# Patient Record
Sex: Male | Born: 1985 | Race: White | Hispanic: No | Smoking: Current every day smoker
Health system: Southern US, Community
[De-identification: ages and names within clinical notes are randomized; demographics above are authoritative.]

## PROBLEM LIST (undated history)

## (undated) HISTORY — PX: MANDIBLE FRACTURE SURGERY: SHX706

---
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
## 2006-01-10 ENCOUNTER — Emergency Department (HOSPITAL_COMMUNITY): Admission: EM | Admit: 2006-01-10 | Discharge: 2006-01-10 | Payer: Self-pay | Admitting: Emergency Medicine

## 2006-03-17 ENCOUNTER — Emergency Department (HOSPITAL_COMMUNITY): Admission: EM | Admit: 2006-03-17 | Discharge: 2006-03-17 | Payer: Self-pay | Admitting: Emergency Medicine

## 2006-03-23 ENCOUNTER — Emergency Department (HOSPITAL_COMMUNITY): Admission: EM | Admit: 2006-03-23 | Discharge: 2006-03-23 | Payer: Self-pay | Admitting: Emergency Medicine

## 2006-09-11 ENCOUNTER — Ambulatory Visit: Payer: Self-pay | Admitting: *Deleted

## 2006-09-11 ENCOUNTER — Emergency Department (HOSPITAL_COMMUNITY): Admission: EM | Admit: 2006-09-11 | Discharge: 2006-09-11 | Payer: Self-pay | Admitting: Emergency Medicine

## 2006-09-11 ENCOUNTER — Inpatient Hospital Stay (HOSPITAL_COMMUNITY): Admission: EM | Admit: 2006-09-11 | Discharge: 2006-09-15 | Payer: Self-pay | Admitting: *Deleted

## 2007-08-30 ENCOUNTER — Inpatient Hospital Stay (HOSPITAL_COMMUNITY): Admission: EM | Admit: 2007-08-30 | Discharge: 2007-08-31 | Payer: Self-pay | Admitting: Emergency Medicine

## 2007-08-31 ENCOUNTER — Encounter: Payer: Self-pay | Admitting: Otolaryngology

## 2007-09-12 ENCOUNTER — Ambulatory Visit (HOSPITAL_COMMUNITY): Admission: RE | Admit: 2007-09-12 | Discharge: 2007-09-12 | Payer: Self-pay | Admitting: Otolaryngology

## 2007-09-24 ENCOUNTER — Ambulatory Visit (HOSPITAL_COMMUNITY): Admission: RE | Admit: 2007-09-24 | Discharge: 2007-09-24 | Payer: Self-pay | Admitting: Otolaryngology

## 2007-10-05 ENCOUNTER — Ambulatory Visit (HOSPITAL_COMMUNITY): Admission: RE | Admit: 2007-10-05 | Discharge: 2007-10-05 | Payer: Self-pay | Admitting: Otolaryngology

## 2007-10-08 ENCOUNTER — Ambulatory Visit (HOSPITAL_BASED_OUTPATIENT_CLINIC_OR_DEPARTMENT_OTHER): Admission: RE | Admit: 2007-10-08 | Discharge: 2007-10-08 | Payer: Self-pay | Admitting: Otolaryngology
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
## 2011-03-25 LAB — BASIC METABOLIC PANEL
BUN: 9
Creatinine, Ser: 0.83
GFR calc Af Amer: >60
GFR calc non Af Amer: >60

## 2011-03-25 LAB — DIFFERENTIAL
Eosinophils Absolute: 0
Eosinophils Relative: 0
Monocytes Absolute: 0.6
Monocytes Relative: 6

## 2011-03-25 LAB — CBC
HCT: 48.3
MCHC: 34.9
MCV: 90.8
Platelets: 203
RBC: 5.31
WBC: 10.8 — ABNORMAL HIGH

## 2011-03-25 LAB — ETHANOL: Alcohol, Ethyl (B): 188 — ABNORMAL HIGH

## 2011-03-29 LAB — POCT HEMOGLOBIN-HEMACUE: Hemoglobin: 17.8 — ABNORMAL HIGH

## 2013-09-01 ENCOUNTER — Emergency Department (HOSPITAL_COMMUNITY)
Admission: EM | Admit: 2013-09-01 | Discharge: 2013-09-01 | Disposition: A | Discharge status: Home / Self Care | Payer: No Typology Code available for payment source | Attending: Emergency Medicine | Admitting: Emergency Medicine

## 2013-09-01 ENCOUNTER — Encounter (HOSPITAL_COMMUNITY): Payer: Self-pay | Admitting: Emergency Medicine

## 2013-09-01 DIAGNOSIS — Y939 Activity, unspecified: Secondary | ICD-10-CM | POA: Insufficient documentation

## 2013-09-01 DIAGNOSIS — Z792 Long term (current) use of antibiotics: Secondary | ICD-10-CM | POA: Insufficient documentation

## 2013-09-01 DIAGNOSIS — S0185XA Open bite of other part of head, initial encounter: Secondary | ICD-10-CM

## 2013-09-01 DIAGNOSIS — S0180XA Unspecified open wound of other part of head, initial encounter: Secondary | ICD-10-CM | POA: Insufficient documentation

## 2013-09-01 DIAGNOSIS — F17200 Nicotine dependence, unspecified, uncomplicated: Secondary | ICD-10-CM | POA: Insufficient documentation

## 2013-09-01 DIAGNOSIS — Y929 Unspecified place or not applicable: Secondary | ICD-10-CM | POA: Insufficient documentation

## 2013-09-01 DIAGNOSIS — Z23 Encounter for immunization: Secondary | ICD-10-CM | POA: Insufficient documentation

## 2013-09-01 DIAGNOSIS — W540XXA Bitten by dog, initial encounter: Secondary | ICD-10-CM | POA: Insufficient documentation

## 2013-09-01 DIAGNOSIS — S61209A Unspecified open wound of unspecified finger without damage to nail, initial encounter: Secondary | ICD-10-CM | POA: Insufficient documentation

## 2013-09-01 MED ORDER — TETANUS-DIPHTH-ACELL PERTUSSIS 5-2.5-18.5 LF-MCG/0.5 IM SUSP
0.5000 mL | Freq: Once | INTRAMUSCULAR | Status: AC
  Administered 2013-09-01: 0.5 mL via INTRAMUSCULAR
  Filled 2013-09-01: qty 0.5

## 2013-09-01 MED ORDER — CIPROFLOXACIN HCL 500 MG PO TABS
500.0000 mg | ORAL_TABLET | Freq: Once | ORAL | Status: AC
  Administered 2013-09-01: 500 mg via ORAL
  Filled 2013-09-01: qty 1

## 2013-09-01 MED ORDER — CLINDAMYCIN HCL 150 MG PO CAPS
150.0000 mg | ORAL_CAPSULE | Freq: Once | ORAL | Status: AC
  Administered 2013-09-01: 150 mg via ORAL
  Filled 2013-09-01: qty 1

## 2013-09-01 MED ORDER — HYDROCODONE-ACETAMINOPHEN 5-325 MG PO TABS: 2.0000 | ORAL_TABLET | ORAL | Status: DC | PRN

## 2013-09-01 MED ORDER — CIPROFLOXACIN HCL 500 MG PO TABS: 500.0000 mg | ORAL_TABLET | Freq: Two times a day (BID) | ORAL | Status: DC

## 2013-09-01 MED ORDER — CLINDAMYCIN HCL 150 MG PO CAPS: 150.0000 mg | ORAL_CAPSULE | Freq: Four times a day (QID) | ORAL | Status: DC

## 2013-09-01 NOTE — Discharge Instructions (Signed)

## 2013-09-01 NOTE — ED Notes (Signed)
Pt presents with c/o dog bite that occurred around 8:15pm tonight. Pt has two bite marks to his left cheek and some bite marks on his left hand. Pt says he feels like his jaw is broken. Pt says the dog is up to date on his rabies shots.
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
## 2023-06-28 ENCOUNTER — Other Ambulatory Visit: Payer: Self-pay

## 2023-06-28 ENCOUNTER — Encounter (HOSPITAL_BASED_OUTPATIENT_CLINIC_OR_DEPARTMENT_OTHER): Payer: Self-pay

## 2023-06-28 ENCOUNTER — Emergency Department (HOSPITAL_BASED_OUTPATIENT_CLINIC_OR_DEPARTMENT_OTHER)
Admission: EM | Admit: 2023-06-28 | Discharge: 2023-06-28 | Disposition: A | Discharge status: Home / Self Care | Payer: 59 | Attending: Emergency Medicine | Admitting: Emergency Medicine

## 2023-06-28 DIAGNOSIS — K6289 Other specified diseases of anus and rectum: Secondary | ICD-10-CM | POA: Diagnosis present

## 2023-06-28 DIAGNOSIS — K644 Residual hemorrhoidal skin tags: Secondary | ICD-10-CM | POA: Diagnosis not present

## 2023-06-28 DIAGNOSIS — K602 Anal fissure, unspecified: Secondary | ICD-10-CM | POA: Insufficient documentation

## 2023-06-28 DIAGNOSIS — K649 Unspecified hemorrhoids: Secondary | ICD-10-CM

## 2023-06-28 NOTE — ED Provider Notes (Signed)
[place] EMERGENCY DEPARTMENT AT [facility] Provider Note   CSN: [id number] Arrival date & time: 06/28/23  1607     History  Chief Complaint  Patient presents with   Rectal Pain    Anthony Faulkner is a 37 y.o. male presents today for a "rectal mass ".  Patient states that he had a painful and itchy rectal mass for approximately 2 to 3 days.  Patient states that he does have mild bright red blood after bowel movements when wiping.  Patient does admit hard stools.  Patient denies abdominal pain, nausea, vomiting, weight loss, urinary symptoms, or fever.  HPI     Home Medications Prior to Admission medications   Medication Sig Start Date End Date Taking? Authorizing Provider  ciprofloxacin (CIPRO) 500 MG tablet Take 1 tablet (500 mg total) by mouth every 12 (twelve) hours. 09/01/13   Rolland Porter, MD  clindamycin (CLEOCIN) 150 MG capsule Take 1 capsule (150 mg total) by mouth every 6 (six) hours. 09/01/13   Rolland Porter, MD  HYDROcodone-acetaminophen (NORCO/VICODIN) 5-325 MG per tablet Take 2 tablets by mouth every 4 (four) hours as needed. 09/01/13   Rolland Porter, MD      Allergies    Amoxicillin    Review of Systems   Review of Systems  Gastrointestinal:  Positive for rectal pain.    Physical Exam Updated Vital Signs BP 115/85 (BP Location: Right Arm)   Pulse 79   Temp 98.4 F (36.9 C) (Oral)   Resp 18   Ht 5\' 11"  (1.803 m)   Wt 79.4 kg   SpO2 98%   BMI 24.41 kg/m  Physical Exam Vitals and nursing note reviewed. Exam conducted with a chaperone present.  Constitutional:      General: He is not in acute distress.    Appearance: Normal appearance. He is well-developed.  HENT:     Head: Normocephalic and atraumatic.     Right Ear: External ear normal.     Left Ear: External ear normal.     Nose: Nose normal.  Eyes:     Extraocular Movements: Extraocular movements intact.     Conjunctiva/sclera: Conjunctivae normal.  Cardiovascular:     Rate and Rhythm:  Normal rate and regular rhythm.     Pulses: Normal pulses.     Heart sounds: Normal heart sounds. No murmur heard. Pulmonary:     Effort: Pulmonary effort is normal. No respiratory distress.     Breath sounds: Normal breath sounds.  Abdominal:     Palpations: Abdomen is soft.     Tenderness: There is no abdominal tenderness.  Genitourinary:    Rectum: Anal fissure and external hemorrhoid present.     Comments: Patient has small approximately 1 cm unsure angulated hemorrhoid at the 9 o'clock position.  Patient also has a small anal fissure at the posterior midline. Musculoskeletal:        General: No swelling.     Cervical back: Neck supple.  Skin:    General: Skin is warm and dry.     Capillary Refill: Capillary refill takes less than 2 seconds.  Neurological:     General: No focal deficit present.     Mental Status: He is alert.     Motor: No weakness.  Psychiatric:        Mood and Affect: Mood normal.     ED Results / Procedures / Treatments   Labs (all labs ordered are listed, but only abnormal results are displayed) Labs Reviewed -  No data to display  EKG None  Radiology No results found.  Procedures Procedures    Medications Ordered in ED Medications - No data to display  ED Course/ Medical Decision Making/ A&P                                 Medical Decision Making  This patient presents to the ED with chief complaint(s) of rectal mass with pertinent past medical history of none which further complicates the presenting complaint. The complaint involves an extensive differential diagnosis and also carries with it a high risk of complications and morbidity.    The differential diagnosis includes external hemorrhoid, anal fissure,   ED Course and Reassessment:  Consultation: - Consulted or discussed management/test interpretation w/ external professional: None  Consideration for admission or further workup: Considered for admission or further workup  however patient's vital signs physical exam are reassuring.  Patient symptoms likely due to external hemorrhoid and anal fissure.  Patient gated on increasing fiber in his diet and water intake.  Patient also educated on Preparation H and witch hazel use for external hemorrhoid.  Patient instructed to follow-up with primary care physician for further evaluation if symptoms persist.        Final Clinical Impression(s) / ED Diagnoses Final diagnoses:  Hemorrhoids, unspecified hemorrhoid type  Anal fissure    Rx / DC Orders ED Discharge Orders     None         Dolphus Jenny, PA-C 06/28/23 1646    Anders Simmonds T, DO 06/29/23 1641

## 2023-06-28 NOTE — Discharge Instructions (Signed)
Today you were seen for a hemorrhoid and anal fissure.  You may use Preparation H or witch hazel as needed.  Please try to increase your fiber and water intake.  Please establish care with a PCP and follow-up with them if your symptoms persist for further evaluation and workup.  Thank you for letting us treat you today. After performing a physical exam, I feel you are safe to go home. Please follow up with your PCP in the next several days and provide them with your records from this visit. Return to the Emergency Room if pain becomes severe or symptoms worsen.

## 2023-06-28 NOTE — ED Triage Notes (Signed)
In for eval of painful rectal mass that is bleeding onset 2-3 days ago. R/O hemorrhoids. Denies abd pain, nausea, vomiting, or difficulty with bowel movements.

## 2023-07-31 ENCOUNTER — Ambulatory Visit (INDEPENDENT_AMBULATORY_CARE_PROVIDER_SITE_OTHER): Payer: 59 | Admitting: Family Medicine

## 2023-07-31 ENCOUNTER — Encounter (HOSPITAL_BASED_OUTPATIENT_CLINIC_OR_DEPARTMENT_OTHER): Payer: Self-pay | Admitting: Family Medicine

## 2023-07-31 DIAGNOSIS — K219 Gastro-esophageal reflux disease without esophagitis: Secondary | ICD-10-CM | POA: Insufficient documentation

## 2023-07-31 DIAGNOSIS — F17200 Nicotine dependence, unspecified, uncomplicated: Secondary | ICD-10-CM | POA: Diagnosis not present

## 2023-07-31 DIAGNOSIS — Z0000 Encounter for general adult medical examination without abnormal findings: Secondary | ICD-10-CM

## 2023-07-31 NOTE — Assessment & Plan Note (Signed)
Has been doing well with use of omeprazole, can continue with medication at this time.

## 2023-07-31 NOTE — Patient Instructions (Signed)
  Medication Instructions:  Your physician recommends that you continue on your current medications as directed. Please refer to the Current Medication list given to you today. --If you need a refill on any your medications before your next appointment, please call your pharmacy first. If no refills are authorized on file call the office.-- Lab Work: Your physician has recommended that you have lab work today: 1 week before  If you have labs (blood work) drawn today and your tests are completely normal, you will receive your results via MyChart message OR a phone call from our staff.  Please ensure you check your voicemail in the event that you authorized detailed messages to be left on a delegated number. If you have any lab test that is abnormal or we need to change your treatment, we will call you to review the results.  Follow-Up: Your next appointment:   Your physician recommends that you schedule a follow-up appointment in: 1 month physical with Dr. de Peru  You will receive a text message or e-mail with a link to a survey about your care and experience with [place] today! We would greatly appreciate your feedback!   Thanks for letting us be apart of your health journey!!  Primary Care and Sports Medicine   Dr. Ceasar Mons Peru   We encourage you to activate your patient portal called "MyChart".  Sign up information is provided on this After Visit Summary.  MyChart is used to connect with patients for Virtual Visits (Telemedicine).  Patients are able to view lab/test results, encounter notes, upcoming appointments, etc.  Non-urgent messages can be sent to your provider as well. To learn more about what you can do with MyChart, please visit --  [email or web address].

## 2023-07-31 NOTE — Progress Notes (Signed)
New Patient Office Visit  Subjective   Patient ID: Anthony Faulkner, male    DOB: October 18, 1985  Age: 38 y.o. MRN: [id number]  CC:  Chief Complaint  Patient presents with   Annual Exam    Pt. Here for annual exam.     HPI Anthony Faulkner presents to establish care Last PCP - none  Reflux: started due to reflux issues, has been taking omeprazole for a number of years. Denies any concerns with medication. Feels that symptoms have been well-controlled. Reports having EGD, told of ulcers, has been on medication due to this.  In ED due to hemorrhoids, anal fissure about 1 month ago. Has been doing well since then. Has been using miralax with good relief.   Tobacco use: smokes 0.5 ppd, has smoked for about 19 years, 0.5 ppd on average. Does think about quitting, notes that smoking is used as stress relief. Notes that he will drink 3-4 beers to relax some days out of the week. Denies any illicit drug use. Does not consume liquor.  Patient is originally from [place]. 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 He enjoys playing with his dog, has girlfriend that he spends time with.  Outpatient Encounter Medications as of 07/31/2023  Medication Sig   omeprazole (PRILOSEC) 40 MG capsule Take 20 mg by mouth once.   [DISCONTINUED] ciprofloxacin (CIPRO) 500 MG tablet Take 1 tablet (500 mg total) by mouth every 12 (twelve) hours. (Patient not taking: Reported on 07/31/2023)   [DISCONTINUED] clindamycin (CLEOCIN) 150 MG capsule Take 1 capsule (150 mg total) by mouth every 6 (six) hours. (Patient not taking: Reported on 07/31/2023)   [DISCONTINUED] HYDROcodone-acetaminophen (NORCO/VICODIN) 5-325 MG per tablet Take 2 tablets by mouth every 4 (four) hours as needed.   No facility-administered encounter medications on file as of 07/31/2023.    No past medical history on file.  Past Surgical History:  Procedure Laterality Date   MANDIBLE FRACTURE SURGERY      No family history on  file.  Social History   Socioeconomic History   Marital status: Divorced    Spouse name: Not on file   Number of children: Not on file   Years of education: Not on file   Highest education level: Not on file  Occupational History   Not on file  Tobacco Use   Smoking status: Every Day    Current packs/day: 0.50    Types: Cigarettes    Passive exposure: Current   Smokeless tobacco: Never  Vaping Use   Vaping status: Some Days  Substance and Sexual Activity   Alcohol use: Yes    Comment: occasionally    Drug use: No   Sexual activity: Not on file  Other Topics Concern   Not on file  Social History Narrative   Not on file   Social Drivers of Health   Financial Resource Strain: Not on file  Food Insecurity: Not on file  Transportation Needs: Not on file  Physical Activity: Not on file  Stress: Not on file  Social Connections: Not on file  Intimate Partner Violence: Not on file    Objective   BP (!) 139/96 (BP Location: Right Arm, Patient Position: Sitting)   Pulse 73   Temp 98.1 F (36.7 C) (Oral)   Ht 5\' 10"  (1.778 m)   Wt 150 lb (68 kg)   SpO2 98%   BMI 21.52 kg/m   Physical Exam  38 year old male in no acute distress Cardiovascular exam with regular  rate and rhythm Lungs clear to auscultation bilaterally  Assessment & Plan:   Wellness examination -     CBC with Differential/Platelet; Future -     Comprehensive metabolic panel; Future -     Lipid panel; Future -     TSH Rfx on Abnormal to Free T4; Future  Tobacco use disorder Assessment & Plan: Presently in contemplative phase.  We will continue to assess readiness to quit and assist as desired in navigating smoking cessation   Gastroesophageal reflux disease, unspecified whether esophagitis present Assessment & Plan: Has been doing well with use of omeprazole, can continue with medication at this time.   Return in about 1 month (around 08/31/2023) for CPE with fasting labs 1 week prior.     ___________________________________________ Anthony Acrey de [place], MD, ABFM, CAQSM Primary Care and Sports Medicine [facility]

## 2023-07-31 NOTE — Assessment & Plan Note (Signed)
Presently in contemplative phase.  We will continue to assess readiness to quit and assist as desired in navigating smoking cessation

## 2023-09-11 ENCOUNTER — Other Ambulatory Visit (HOSPITAL_BASED_OUTPATIENT_CLINIC_OR_DEPARTMENT_OTHER): Payer: 59

## 2023-09-11 ENCOUNTER — Other Ambulatory Visit (HOSPITAL_BASED_OUTPATIENT_CLINIC_OR_DEPARTMENT_OTHER): Payer: Self-pay | Admitting: *Deleted

## 2023-09-11 DIAGNOSIS — Z0000 Encounter for general adult medical examination without abnormal findings: Secondary | ICD-10-CM

## 2023-09-11 LAB — LIPID PANEL
Chol/HDL Ratio: 2.8 ratio (ref 0.0–5.0)
Cholesterol, Total: 175 mg/dL (ref 100–199)
HDL: 62 mg/dL (ref >39)
LDL Chol Calc (NIH): 85 mg/dL (ref 0–99)
Triglycerides: 167 mg/dL — ABNORMAL HIGH (ref 0–149)
VLDL Cholesterol Cal: 28 mg/dL (ref 5–40)

## 2023-09-11 LAB — COMPREHENSIVE METABOLIC PANEL
ALT: 24 IU/L (ref 0–44)
AST: 21 IU/L (ref 0–40)
Albumin: 4.3 g/dL (ref 4.1–5.1)
Alkaline Phosphatase: 68 IU/L (ref 44–121)
BUN/Creatinine Ratio: 15 (ref 9–20)
BUN: 16 mg/dL (ref 6–20)
Bilirubin Total: 0.3 mg/dL (ref 0.0–1.2)
CO2: 23 mmol/L (ref 20–29)
Calcium: 9.2 mg/dL (ref 8.7–10.2)
Chloride: 106 mmol/L (ref 96–106)
Creatinine, Ser: 1.09 mg/dL (ref 0.76–1.27)
Globulin, Total: 2 g/dL (ref 1.5–4.5)
Glucose: 129 mg/dL — ABNORMAL HIGH (ref 70–99)
Potassium: 4.2 mmol/L (ref 3.5–5.2)
Sodium: 141 mmol/L (ref 134–144)
Total Protein: 6.3 g/dL (ref 6.0–8.5)
eGFR: 89 mL/min/{1.73_m2} (ref >59)

## 2023-09-11 LAB — CBC WITH DIFFERENTIAL/PLATELET
Basophils Absolute: 0.1 10*3/uL (ref 0.0–0.2)
Basos: 1 %
EOS (ABSOLUTE): 0.2 10*3/uL (ref 0.0–0.4)
Eos: 3 %
Hematocrit: 44.1 % (ref 37.5–51.0)
Hemoglobin: 14.7 g/dL (ref 13.0–17.7)
Immature Grans (Abs): 0 10*3/uL (ref 0.0–0.1)
Immature Granulocytes: 0 %
Lymphocytes Absolute: 2.2 10*3/uL (ref 0.7–3.1)
Lymphs: 33 %
MCH: 31 pg (ref 26.6–33.0)
MCHC: 33.3 g/dL (ref 31.5–35.7)
MCV: 93 fL (ref 79–97)
Monocytes Absolute: 0.6 10*3/uL (ref 0.1–0.9)
Monocytes: 9 %
Neutrophils Absolute: 3.6 10*3/uL (ref 1.4–7.0)
Neutrophils: 54 %
Platelets: 207 10*3/uL (ref 150–450)
RBC: 4.74 x10E6/uL (ref 4.14–5.80)
RDW: 12.3 % (ref 11.6–15.4)
WBC: 6.7 10*3/uL (ref 3.4–10.8)

## 2023-09-11 LAB — TSH RFX ON ABNORMAL TO FREE T4: TSH: 1.14 u[IU]/mL (ref 0.450–4.500)

## 2023-09-18 ENCOUNTER — Encounter (HOSPITAL_BASED_OUTPATIENT_CLINIC_OR_DEPARTMENT_OTHER): Payer: Self-pay | Admitting: Family Medicine

## 2023-09-18 ENCOUNTER — Ambulatory Visit (INDEPENDENT_AMBULATORY_CARE_PROVIDER_SITE_OTHER): Payer: 59 | Admitting: Family Medicine

## 2023-09-18 VITALS — BP 123/75 | HR 69 | Ht 70.0 in | Wt 152.4 lb

## 2023-09-18 DIAGNOSIS — Z0000 Encounter for general adult medical examination without abnormal findings: Secondary | ICD-10-CM | POA: Diagnosis not present

## 2023-09-18 NOTE — Patient Instructions (Signed)
  Medication Instructions:  Your physician recommends that you continue on your current medications as directed. Please refer to the Current Medication list given to you today. --If you need a refill on any your medications before your next appointment, please call your pharmacy first. If no refills are authorized on file call the office.-- Lab Work: Your physician has recommended that you have lab work today: 1 week before next visit If you have labs (blood work) drawn today and your tests are completely normal, you will receive your results via MyChart message OR a phone call from our staff.  Please ensure you check your voicemail in the event that you authorized detailed messages to be left on a delegated number. If you have any lab test that is abnormal or we need to change your treatment, we will call you to review the results.    Follow-Up: Your next appointment:   Your physician recommends that you schedule a follow-up appointment in: 1 year physical with Dr. de Peru  You will receive a text message or e-mail with a link to a survey about your care and experience with [place] today! We would greatly appreciate your feedback!   Thanks for letting us be apart of your health journey!!  Primary Care and Sports Medicine   Dr. Ceasar Mons [place]   We encourage you to activate your patient portal called "MyChart".  Sign up information is provided on this After Visit Summary.  MyChart is used to connect with patients for Virtual Visits (Telemedicine).  Patients are able to view lab/test results, encounter notes, upcoming appointments, etc.  Non-urgent messages can be sent to your provider as well. To learn more about what you can do with MyChart, please visit --  [email or web address].

## 2023-09-18 NOTE — Progress Notes (Signed)
 Subjective:    CC: Annual Physical Exam  HPI: Anthony Faulkner is a 38 y.o. presenting for annual physical  I reviewed the past medical history, family history, social history, surgical history, and allergies today and no changes were needed.  Please see the problem list section below in epic for further details.  Past Medical History: History reviewed. No pertinent past medical history. Past Surgical History: Past Surgical History:  Procedure Laterality Date   MANDIBLE FRACTURE SURGERY     Social History: Social History   Socioeconomic History   Marital status: Divorced    Spouse name: Not on file   Number of children: Not on file   Years of education: Not on file   Highest education level: Not on file  Occupational History   Not on file  Tobacco Use   Smoking status: Every Day    Current packs/day: 0.50    Types: Cigarettes    Passive exposure: Current   Smokeless tobacco: Never  Vaping Use   Vaping status: Some Days  Substance and Sexual Activity   Alcohol use: Yes    Comment: occasionally    Drug use: No   Sexual activity: Not on file  Other Topics Concern   Not on file  Social History Narrative   Not on file   Social Drivers of Health   Financial Resource Strain: Low Risk  (09/18/2023)   Overall Financial Resource Strain (CARDIA)    Difficulty of Paying Living Expenses: Not hard at all  Food Insecurity: No Food Insecurity (09/18/2023)   Hunger Vital Sign    Worried About Running Out of Food in the Last Year: Never true    Ran Out of Food in the Last Year: Never true  Transportation Needs: No Transportation Needs (09/18/2023)   PRAPARE - Administrator, Civil Service (Medical): No    Lack of Transportation (Non-Medical): No  Physical Activity: Sufficiently Active (09/18/2023)   Exercise Vital Sign    Days of Exercise per Week: 6 days    Minutes of Exercise per Session: 30 min  Stress: No Stress Concern Present (09/18/2023)   [employer] of Occupational Health - Occupational Stress Questionnaire    Feeling of Stress : Not at all  Social Connections: Socially Isolated (09/18/2023)   Social Connection and Isolation Panel [NHANES]    Frequency of Communication with Friends and Family: More than three times a week    Frequency of Social Gatherings with Friends and Family: More than three times a week    Attends Religious Services: Never    Database administrator or Organizations: No    Attends Banker Meetings: Never    Marital Status: Never married   Family History: History reviewed. No pertinent family history. Allergies: Allergies  Allergen Reactions   Amoxicillin Hives   Medications: See med rec.  Review of Systems: No headache, visual changes, nausea, vomiting, diarrhea, constipation, dizziness, abdominal pain, skin rash, fevers, chills, night sweats, swollen lymph nodes, weight loss, chest pain, body aches, joint swelling, muscle aches, shortness of breath, mood changes, visual or auditory hallucinations.  Objective:    BP 123/75 (BP Location: Right Arm, Patient Position: Sitting, Cuff Size: Normal)   Pulse 69   Ht 5\' 10"  (1.778 m)   Wt 152 lb 6.4 oz (69.1 kg)   SpO2 98%   BMI 21.87 kg/m   General: Well Developed, well nourished, and in no acute distress. Neuro: Alert and oriented x3, extra-ocular muscles intact,  sensation grossly intact. Cranial nerves II through XII are intact, motor, sensory, and coordinative functions are all intact. HEENT: Normocephalic, atraumatic, pupils equal round reactive to light, neck supple, no masses, no lymphadenopathy, thyroid nonpalpable. Oropharynx, nasopharynx, external ear canals are unremarkable. Skin: Warm and dry, no rashes noted. Cardiac: Regular rate and rhythm, no murmurs rubs or gallops. Respiratory: Clear to auscultation bilaterally. Not using accessory muscles, speaking in full sentences. Abdominal: Soft, nontender, nondistended, positive  bowel sounds, no masses, no organomegaly. Musculoskeletal: Shoulder, elbow, wrist, hip, knee, ankle stable, and with full range of motion.  Impression and Recommendations:    Wellness examination Assessment & Plan: Routine HCM labs reviewed. HCM reviewed/discussed. Anticipatory guidance regarding healthy weight, lifestyle and choices given. Recommend healthy diet.  Recommend approximately 150 minutes/week of moderate intensity exercise Recommend regular dental and vision exams Always use seatbelt/lap and shoulder restraints Recommend using smoke alarms and checking batteries at least twice a year Recommend using sunscreen when outside Discussed tetanus immunization recommendations, patient would like to return for this   Patient does also mention some left heel pain.  Worse with first up out of bed.  Has tried change in footwear.  Does have some tenderness to palpation over calcaneus.  Suspect plantar fasciitis, discussed patient, discussed conservative measures.  If continues to be bothersome, can consider referral to podiatry.  Return in about 1 year (around 09/17/2024) for CPE.   ___________________________________________ Bralynn Donado de Peru, MD, ABFM, [facility] Primary Care and Sports Medicine [facility]

## 2023-09-18 NOTE — Assessment & Plan Note (Signed)
 Routine HCM labs reviewed. HCM reviewed/discussed. Anticipatory guidance regarding healthy weight, lifestyle and choices given. Recommend healthy diet.  Recommend approximately 150 minutes/week of moderate intensity exercise Recommend regular dental and vision exams Always use seatbelt/lap and shoulder restraints Recommend using smoke alarms and checking batteries at least twice a year Recommend using sunscreen when outside Discussed tetanus immunization recommendations, patient would like to return for this

## 2024-09-18 ENCOUNTER — Encounter (HOSPITAL_BASED_OUTPATIENT_CLINIC_OR_DEPARTMENT_OTHER): Admitting: Family Medicine
